# Patient Record
Sex: Male | Born: 1975 | Race: White | Hispanic: No | Marital: Single | Smoking: Current every day smoker
Health system: Southern US, Community
[De-identification: ages and names within clinical notes are randomized; demographics above are authoritative.]

## PROBLEM LIST (undated history)

## (undated) DIAGNOSIS — F329 Major depressive disorder, single episode, unspecified: Secondary | ICD-10-CM

## (undated) DIAGNOSIS — R768 Other specified abnormal immunological findings in serum: Secondary | ICD-10-CM

## (undated) DIAGNOSIS — F32A Depression, unspecified: Secondary | ICD-10-CM

---

## 2007-09-08 ENCOUNTER — Emergency Department (HOSPITAL_COMMUNITY): Admission: EM | Admit: 2007-09-08 | Discharge: 2007-09-09 | Payer: Self-pay | Admitting: Emergency Medicine

## 2007-11-20 ENCOUNTER — Emergency Department (HOSPITAL_COMMUNITY): Admission: EM | Admit: 2007-11-20 | Discharge: 2007-11-20 | Payer: Self-pay | Admitting: Emergency Medicine

## 2007-11-22 ENCOUNTER — Emergency Department (HOSPITAL_COMMUNITY): Admission: EM | Admit: 2007-11-22 | Discharge: 2007-11-22 | Payer: Self-pay | Admitting: Emergency Medicine

## 2011-06-24 ENCOUNTER — Emergency Department (HOSPITAL_COMMUNITY): Payer: Self-pay

## 2011-06-24 ENCOUNTER — Emergency Department (HOSPITAL_COMMUNITY)
Admission: EM | Admit: 2011-06-24 | Discharge: 2011-06-24 | Disposition: A | Discharge status: Home / Self Care | Payer: No Typology Code available for payment source | Attending: Emergency Medicine | Admitting: Emergency Medicine

## 2011-06-24 DIAGNOSIS — R748 Abnormal levels of other serum enzymes: Secondary | ICD-10-CM | POA: Insufficient documentation

## 2011-06-24 DIAGNOSIS — Y92410 Unspecified street and highway as the place of occurrence of the external cause: Secondary | ICD-10-CM | POA: Insufficient documentation

## 2011-06-24 DIAGNOSIS — F1910 Other psychoactive substance abuse, uncomplicated: Secondary | ICD-10-CM | POA: Insufficient documentation

## 2011-06-24 DIAGNOSIS — M542 Cervicalgia: Secondary | ICD-10-CM | POA: Insufficient documentation

## 2011-06-24 DIAGNOSIS — R109 Unspecified abdominal pain: Secondary | ICD-10-CM | POA: Insufficient documentation

## 2011-06-24 DIAGNOSIS — M549 Dorsalgia, unspecified: Secondary | ICD-10-CM | POA: Insufficient documentation

## 2011-06-24 LAB — DIFFERENTIAL
Eosinophils Absolute: 0.1 10*3/uL (ref 0.0–0.7)
Lymphocytes Relative: 63 % — ABNORMAL HIGH (ref 12–46)
Lymphs Abs: 5 10*3/uL — ABNORMAL HIGH (ref 0.7–4.0)
Monocytes Relative: 11 % (ref 3–12)
Neutrophils Relative %: 24 % — ABNORMAL LOW (ref 43–77)

## 2011-06-24 LAB — CBC
HCT: 44.6 % (ref 39.0–52.0)
Hemoglobin: 15 g/dL (ref 13.0–17.0)
MCH: 30.4 pg (ref 26.0–34.0)
MCV: 90.3 fL (ref 78.0–100.0)
RBC: 4.94 MIL/uL (ref 4.22–5.81)
WBC: 8 10*3/uL (ref 4.0–10.5)

## 2011-06-24 LAB — RAPID URINE DRUG SCREEN, HOSP PERFORMED
Amphetamines: NOT DETECTED
Opiates: POSITIVE — AB
Tetrahydrocannabinol: NOT DETECTED

## 2011-06-24 LAB — TYPE AND SCREEN
ABO/RH(D): O POS
Antibody Screen: NEGATIVE

## 2011-06-24 LAB — COMPREHENSIVE METABOLIC PANEL
AST: 354 U/L — ABNORMAL HIGH (ref 0–37)
BUN: 12 mg/dL (ref 6–23)
CO2: 27 mEq/L (ref 19–32)
Calcium: 8.9 mg/dL (ref 8.4–10.5)
Chloride: 103 mEq/L (ref 96–112)
Creatinine, Ser: 0.98 mg/dL (ref 0.50–1.35)
GFR calc Af Amer: >60 mL/min (ref >60)
GFR calc non Af Amer: >60 mL/min (ref >60)
Glucose, Bld: 84 mg/dL (ref 70–99)
Total Bilirubin: 0.8 mg/dL (ref 0.3–1.2)

## 2011-06-24 LAB — URINALYSIS, ROUTINE W REFLEX MICROSCOPIC
Glucose, UA: NEGATIVE mg/dL
Leukocytes, UA: NEGATIVE
Protein, ur: NEGATIVE mg/dL
Urobilinogen, UA: 1 mg/dL (ref 0.0–1.0)

## 2011-06-24 MED ORDER — IOHEXOL 300 MG/ML  SOLN
100.0000 mL | Freq: Once | INTRAMUSCULAR | Status: AC | PRN
  Administered 2011-06-24: 100 mL via INTRAVENOUS

## 2011-07-07 ENCOUNTER — Other Ambulatory Visit (HOSPITAL_COMMUNITY): Payer: Self-pay | Admitting: Family Medicine

## 2011-07-07 ENCOUNTER — Ambulatory Visit (HOSPITAL_COMMUNITY)
Admission: RE | Admit: 2011-07-07 | Discharge: 2011-07-07 | Disposition: A | Discharge status: Home / Self Care | Payer: Self-pay | Source: Ambulatory Visit | Attending: Family Medicine | Admitting: Family Medicine

## 2011-07-07 ENCOUNTER — Other Ambulatory Visit: Payer: Self-pay | Admitting: Internal Medicine

## 2011-07-07 DIAGNOSIS — S93409A Sprain of unspecified ligament of unspecified ankle, initial encounter: Secondary | ICD-10-CM

## 2011-07-07 DIAGNOSIS — M7989 Other specified soft tissue disorders: Secondary | ICD-10-CM | POA: Insufficient documentation

## 2011-07-07 DIAGNOSIS — M779 Enthesopathy, unspecified: Secondary | ICD-10-CM

## 2011-07-07 DIAGNOSIS — M25579 Pain in unspecified ankle and joints of unspecified foot: Secondary | ICD-10-CM | POA: Insufficient documentation

## 2011-07-07 DIAGNOSIS — M79609 Pain in unspecified limb: Secondary | ICD-10-CM | POA: Insufficient documentation

## 2011-09-30 LAB — URINALYSIS, ROUTINE W REFLEX MICROSCOPIC
Hgb urine dipstick: NEGATIVE
Nitrite: NEGATIVE
Protein, ur: NEGATIVE
Urobilinogen, UA: 0.2

## 2012-07-23 DIAGNOSIS — IMO0002 Reserved for concepts with insufficient information to code with codable children: Secondary | ICD-10-CM
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
## 2015-07-24 ENCOUNTER — Encounter (HOSPITAL_COMMUNITY): Payer: Self-pay | Admitting: *Deleted

## 2015-07-24 ENCOUNTER — Emergency Department (HOSPITAL_COMMUNITY): Payer: Self-pay

## 2015-07-24 ENCOUNTER — Inpatient Hospital Stay (HOSPITAL_COMMUNITY)
Admission: EM | Admit: 2015-07-24 | Discharge: 2015-07-29 | DRG: 558 | Disposition: A | Discharge status: Home / Self Care | Payer: Self-pay | Attending: Family Medicine | Admitting: Family Medicine

## 2015-07-24 DIAGNOSIS — M791 Myalgia, unspecified site: Secondary | ICD-10-CM | POA: Diagnosis present

## 2015-07-24 DIAGNOSIS — E860 Dehydration: Secondary | ICD-10-CM | POA: Diagnosis present

## 2015-07-24 DIAGNOSIS — R55 Syncope and collapse: Secondary | ICD-10-CM | POA: Diagnosis present

## 2015-07-24 DIAGNOSIS — F418 Other specified anxiety disorders: Secondary | ICD-10-CM | POA: Diagnosis present

## 2015-07-24 DIAGNOSIS — Z8249 Family history of ischemic heart disease and other diseases of the circulatory system: Secondary | ICD-10-CM

## 2015-07-24 DIAGNOSIS — Z720 Tobacco use: Secondary | ICD-10-CM | POA: Diagnosis present

## 2015-07-24 DIAGNOSIS — R768 Other specified abnormal immunological findings in serum: Secondary | ICD-10-CM

## 2015-07-24 DIAGNOSIS — M6282 Rhabdomyolysis: Principal | ICD-10-CM | POA: Diagnosis present

## 2015-07-24 DIAGNOSIS — R740 Nonspecific elevation of levels of transaminase and lactic acid dehydrogenase [LDH]: Secondary | ICD-10-CM

## 2015-07-24 DIAGNOSIS — R7401 Elevation of levels of liver transaminase levels: Secondary | ICD-10-CM | POA: Diagnosis present

## 2015-07-24 DIAGNOSIS — R7689 Other specified abnormal immunological findings in serum: Secondary | ICD-10-CM

## 2015-07-24 DIAGNOSIS — R531 Weakness: Secondary | ICD-10-CM

## 2015-07-24 DIAGNOSIS — F17210 Nicotine dependence, cigarettes, uncomplicated: Secondary | ICD-10-CM | POA: Diagnosis present

## 2015-07-24 HISTORY — DX: Depression, unspecified: F32.A

## 2015-07-24 HISTORY — DX: Other specified abnormal immunological findings in serum: R76.8

## 2015-07-24 HISTORY — DX: Major depressive disorder, single episode, unspecified: F32.9

## 2015-07-24 LAB — CBC WITH DIFFERENTIAL/PLATELET
Basophils Absolute: 0 10*3/uL (ref 0.0–0.1)
Basophils Relative: 1 % (ref 0–1)
Eosinophils Absolute: 0.2 10*3/uL (ref 0.0–0.7)
Eosinophils Relative: 4 % (ref 0–5)
HEMATOCRIT: 40.8 % (ref 39.0–52.0)
Hemoglobin: 14.2 g/dL (ref 13.0–17.0)
LYMPHS ABS: 2.6 10*3/uL (ref 0.7–4.0)
LYMPHS PCT: 46 % (ref 12–46)
MCH: 30.1 pg (ref 26.0–34.0)
MCHC: 34.8 g/dL (ref 30.0–36.0)
MCV: 86.4 fL (ref 78.0–100.0)
MONO ABS: 0.7 10*3/uL (ref 0.1–1.0)
Monocytes Relative: 11 % (ref 3–12)
NEUTROS PCT: 38 % — AB (ref 43–77)
Neutro Abs: 2.2 10*3/uL (ref 1.7–7.7)
Platelets: 184 10*3/uL (ref 150–400)
RBC: 4.72 MIL/uL (ref 4.22–5.81)
RDW: 14.3 % (ref 11.5–15.5)
WBC: 5.7 10*3/uL (ref 4.0–10.5)

## 2015-07-24 LAB — COMPREHENSIVE METABOLIC PANEL
ALBUMIN: 3.9 g/dL (ref 3.5–5.0)
ALT: 123 U/L — AB (ref 17–63)
ANION GAP: 6 (ref 5–15)
AST: 237 U/L — AB (ref 15–41)
Alkaline Phosphatase: 64 U/L (ref 38–126)
BUN: 11 mg/dL (ref 6–20)
CHLORIDE: 104 mmol/L (ref 101–111)
CO2: 29 mmol/L (ref 22–32)
CREATININE: 1.16 mg/dL (ref 0.61–1.24)
Calcium: 8.4 mg/dL — ABNORMAL LOW (ref 8.9–10.3)
GFR calc Af Amer: >60 mL/min (ref >60)
GFR calc non Af Amer: >60 mL/min (ref >60)
Glucose, Bld: 89 mg/dL (ref 65–99)
POTASSIUM: 3.6 mmol/L (ref 3.5–5.1)
Sodium: 139 mmol/L (ref 135–145)
Total Bilirubin: 0.7 mg/dL (ref 0.3–1.2)
Total Protein: 6.6 g/dL (ref 6.5–8.1)

## 2015-07-24 LAB — CK: Total CK: 6941 U/L — ABNORMAL HIGH (ref 49–397)

## 2015-07-24 MED ORDER — SODIUM CHLORIDE 0.9 % IV BOLUS (SEPSIS)
1000.0000 mL | Freq: Once | INTRAVENOUS | Status: AC
  Administered 2015-07-24: 1000 mL via INTRAVENOUS

## 2015-07-24 MED ORDER — TRAMADOL HCL 50 MG PO TABS
50.0000 mg | ORAL_TABLET | Freq: Once | ORAL | Status: AC
  Administered 2015-07-24: 50 mg via ORAL
  Filled 2015-07-24: qty 1

## 2015-07-24 NOTE — ED Notes (Signed)
Pt reporting that he has been weak since Tuesday. States that he has had leg cramps and weakness worsening since that time.  Reports being seen at [facility] on Tuesday and was diagnosed with dehydration. Pt denies nausea or vomiting.  Tolerating p.o liquids without difficulty.

## 2015-07-24 NOTE — H&P (Addendum)
[facility] History and Physical  GAYLORD SEYDEL ZOX:[id number] DOB: October 02, 1976 DOA: 07/24/2015  Referring physician: Dr. Jeraldine Loots - [facility] PCP: Colette Ribas, MD   Chief Complaint: Leg pain  HPI: Mario Sharp is a 39 y.o. male  3 days patient developed a syncopal or near-syncopal episode while washing his truck. Patient states he had just barely started washing his truck with a friend when he started to feel ill and collapsed to the ground. Very poor recollection as to what occurred during this period of time that was witnessed by his friend. States that the patient was moving around not responding well to his questions. No direct admission to loss of consciousness. No head injury. Patient states that he went to [facility] for evaluation was told that he was dehydrated and given 2 L of IV normal saline. Patient states that he did not feel any better after the fluids and over the last 2 days has continued to feel aggressively more ill. One of his primary complaints is been bilateral lower extremity pain. States that his legs ache severely. Constant. Getting worse. No relief and had very little sleep last night. Patient is not taken anything for his pain. Patient states that he has not exerted himself recently. Does not exercise. Sweats very little. Drinks about a half a gallon of iced tea per day, though over the last 2 days he's had very little to drink or eat due to the loss of appetite. Has never had symptoms like this before. He states that he is followed multiple times at home over the last 2 days as his legs and just collapsed underneath of him. He developed a slight headache 1 day ago which is now resolved. Denies any drug use. Denies any joint swelling, rash.  Review of Systems:  Constitutional:  No weight loss, night sweats, Fevers, chills,  HEENT:  No Difficulty swallowing,Tooth/dental problems,Sore throat,  No sneezing, itching, ear ache, nasal congestion, post nasal  drip,  Cardio-vascular:  No chest pain, Orthopnea, PND, swelling in lower extremities, anasarca, dizziness, palpitations  GI:  No heartburn, indigestion, abdominal pain, nausea, vomiting, diarrhea, change in bowel habits,   Resp:   No shortness of breath with exertion or at rest. No excess mucus, no productive cough, No non-productive cough, No coughing up of blood.No change in color of mucus.No wheezing.No chest wall deformity  Skin:  no rash or lesions.  GU:  no dysuria, change in color of urine, no urgency or frequency. No flank pain.  Musculoskeletal:   Per HPI Psych:  No change in mood or affect. No depression or anxiety. No memory loss.   Past Medical History  Diagnosis Date  . Depression    History reviewed. No pertinent past surgical history. Social History:  reports that he has been smoking Cigarettes.  He has been smoking about 0.50 packs per day. He does not have any smokeless tobacco history on file. He reports that he drinks alcohol. He reports that he does not use illicit drugs.  No Known Allergies  Family History  Problem Relation Age of Onset  . Coronary artery disease Father     quadruple bypass     Prior to Admission medications   Medication Sig Start Date End Date Taking? Authorizing Provider  ALPRAZolam Prudy Feeler) 1 MG tablet Take 1.5 mg by mouth daily as needed for anxiety.    Yes Historical Provider, MD  sertraline (ZOLOFT) 100 MG tablet Take 100 mg by mouth daily.   Yes Historical  Provider, MD   Physical Exam: Filed Vitals:   07/24/15 1927 07/24/15 2206 07/24/15 2319 07/24/15 2322  BP: 145/99 125/82 127/84 127/84  Pulse: 106 85 86 90  Temp: 98.1 F (36.7 C)     TempSrc: Oral     Resp: Height: 6' (1.829 m)     Weight: 103.874 kg (229 lb)     SpO2: 98% 98% 100% 98%    Wt Readings from Last 3 Encounters:  07/24/15 103.874 kg (229 lb)    General:  Appears calm and comfortable Eyes:  PERRL, normal lids, irises & conjunctiva ENT:   grossly normal hearing, lips & tongue Neck:  no LAD, masses or thyromegaly Cardiovascular:  RRR, no m/r/g. No LE edema. 2+ pedal pulses, warm extremities Respiratory:  CTA bilaterally, no w/r/r. Normal respiratory effort. Abdomen:  soft, ntnd Skin:  no rash or induration seen on limited exam Musculoskeletal:  grossly normal tone BUE/BLE Psychiatric:  grossly normal mood and affect, speech fluent and appropriate Neurologic:  grossly non-focal.          Labs on Admission:  Basic Metabolic Panel:  Recent Labs Lab 07/24/15 2101  NA 139  K 3.6  CL 104  CO2 29  GLUCOSE 89  BUN 11  CREATININE 1.16  CALCIUM 8.4*   Liver Function Tests:  Recent Labs Lab 07/24/15 2101  AST 237*  ALT 123*  ALKPHOS 64  BILITOT 0.7  PROT 6.6  ALBUMIN 3.9   No results for input(s): LIPASE, AMYLASE in the last 168 hours. No results for input(s): AMMONIA in the last 168 hours. CBC:  Recent Labs Lab 07/24/15 2101  WBC 5.7  NEUTROABS 2.2  HGB 14.2  HCT 40.8  MCV 86.4  PLT 184   Cardiac Enzymes:  Recent Labs Lab 07/24/15 2101  CKTOTAL 6941*    BNP (last 3 results) No results for input(s): BNP in the last 8760 hours.  ProBNP (last 3 results) No results for input(s): PROBNP in the last 8760 hours.  CBG: No results for input(s): GLUCAP in the last 168 hours.  Radiological Exams on Admission: Ct Head Wo Contrast  07/24/2015   CLINICAL DATA:  Acute onset of gradually worsening weakness. Syncope. Drooling and headache. Initial encounter.  EXAM: CT HEAD WITHOUT CONTRAST  TECHNIQUE: Contiguous axial images were obtained from the base of the skull through the vertex without intravenous contrast.  COMPARISON:  None.  FINDINGS: There is no evidence of acute infarction, mass lesion, or intra- or extra-axial hemorrhage on CT.  The posterior fossa, including the cerebellum, brainstem and fourth ventricle, is within normal limits. The third and lateral ventricles, and basal ganglia are  unremarkable in appearance. The cerebral hemispheres are symmetric in appearance, with normal gray-white differentiation. No mass effect or midline shift is seen.  There is no evidence of fracture; visualized osseous structures are unremarkable in appearance. The visualized portions of the orbits are within normal limits. The paranasal sinuses and mastoid air cells are well-aerated. No significant soft tissue abnormalities are seen.  IMPRESSION: Unremarkable noncontrast CT of the head.   Electronically Signed   By: Roanna Raider M.D.   On: 07/24/2015 22:38    Assessment/Plan Principal Problem:   Rhabdomyolysis Active Problems:   Near syncope   Transaminitis   Depression with anxiety   Tobacco use   Rhabdomyolysis: CK 6941. Etiology not immediately clear that may be due to dehydration, heat exhaustion, drug use, other metabolic myopathy. Pedal pulses present. K normal -  tele (due to syncope or near syncope) - Trend CK in am - IVF NS 133ml/hr - UDS - Tylenol, morphine  Near syncope vs syncope: likely related to dehydration vs vasovagal vs arrhythmia. No evidence of seizure like activity. Only one occurrence 2 days ago - Tele - orthostatics - IVF as above  Transaminitis: AST 237, ALT 123. Pt states he drinks a 6 pack of beer over several weeks. Likely from rhabdo.  - CMET in am - ETOH  Depression/anxiety: - Continue zoloft and xanax  Tobacco: 1/2ppd - nicotine   Code Status: FUll DVT Prophylaxis: Lovenox Family Communication: father Disposition Plan: pending improvement  Anthonie Lotito J, MD Family Medicine [facility] [email or web address] Password [email or web address]

## 2015-07-24 NOTE — ED Notes (Signed)
Pt. Reports generalized weakness since Tuesday. Pt. Reports leg cramps. Pt. Reports poor appetite but denies nausea/vomiting/diarrhea.

## 2015-07-24 NOTE — ED Provider Notes (Signed)
CSN: [id number]     Arrival date & time 07/24/15  1922 History  This chart was scribed for Mario Munch, MD by Phillis Haggis, ED Scribe. This patient was seen in room [facility] and patient care was started at 9:02 PM.     Chief Complaint  Patient presents with  . Weakness   The history is provided by the patient. No language interpreter was used.  HPI Comments: Mario Sharp is a 39 y.o. male who presents to the Emergency Department complaining of gradually worsening weakness onset 2 days ago. Pt states that he was helping a friend with yard work when he experienced syncope and was unable to stand up after with drooling. States that he was seen at [facility] and given saline with a diagnosis of dehydration. States that he has been unable to keep balance and stand up, with continual cramping leg pain, hurting like "toothaches." Reports that the weakness is all over, but worst in the legs with associated headache that has since subsided. Denies confusion, fever, neck pain, back pain, sick contacts, recent long travel, insect bites, or any significant medical history. States that he takes Zoloft and Xanax daily but is decreasing Zoloft and his smoking of .5 ppd.   Smoking cessation provided, particularly in light of this patient's evaluation in the ED.   Past Medical History  Diagnosis Date  . Depression    History reviewed. No pertinent past surgical history. History reviewed. No pertinent family history. History  Substance Use Topics  . Smoking status: Current Every Day Smoker -- 1.00 packs/day  . Smokeless tobacco: Not on file  . Alcohol Use: Yes     Comment: occasion    Review of Systems  Constitutional:       Per HPI, otherwise negative  HENT:       Per HPI, otherwise negative  Respiratory:       Per HPI, otherwise negative  Cardiovascular:       Per HPI, otherwise negative  Gastrointestinal: Negative for vomiting.  Endocrine:       Negative aside from HPI  Genitourinary:        Neg aside from HPI   Musculoskeletal:       Per HPI, otherwise negative  Skin: Negative.   Neurological: Negative for syncope.   Allergies  Review of patient's allergies indicates no known allergies.  Home Medications   Prior to Admission medications   Medication Sig Start Date End Date Taking? Authorizing Provider  ALPRAZolam Prudy Feeler) 1 MG tablet Take 1.5 mg by mouth daily as needed for anxiety.    Yes Historical Provider, MD  sertraline (ZOLOFT) 100 MG tablet Take 100 mg by mouth daily.   Yes Historical Provider, MD   BP 145/99 mmHg  Pulse 106  Temp(Src) 98.1 F (36.7 C) (Oral)  Resp 20  Ht 6' (1.829 m)  Wt 229 lb (103.874 kg)  BMI 31.05 kg/m2  SpO2 98%  Physical Exam  Constitutional: He is oriented to person, place, and time. He appears well-developed. No distress.  HENT:  Head: Normocephalic and atraumatic.  Eyes: Conjunctivae and EOM are normal.  Neck: Normal range of motion. Neck supple.  Cardiovascular: Normal rate and regular rhythm.   Pulmonary/Chest: Effort normal. No stridor. No respiratory distress.  Abdominal: He exhibits no distension.  Musculoskeletal: He exhibits no edema.  Pain with hip flexion bilaterally, but strength is 5/5 bilaterally  Neurological: He is alert and oriented to person, place, and time.  Grip strength 5/5; no pronator  drift; sensation intact  Skin: Skin is warm and dry.  Psychiatric: He has a normal mood and affect.  Nursing note and vitals reviewed.   ED Course  Procedures (including critical care time) DIAGNOSTIC STUDIES: Oxygen Saturation is 98% on RA, normal by my interpretation.    COORDINATION OF CARE: 9:07 PM-Discussed treatment plan which includes labs and fluids with pt at bedside and pt agreed to plan.    Labs Review Labs Reviewed  COMPREHENSIVE METABOLIC PANEL - Abnormal; Notable for the following:    Calcium 8.4 (*)    AST 237 (*)    ALT 123 (*)    All other components within normal limits  CBC WITH  DIFFERENTIAL/PLATELET - Abnormal; Notable for the following:    Neutrophils Relative % 38 (*)    All other components within normal limits  CK - Abnormal; Notable for the following:    Total CK 6941 (*)    All other components within normal limits  URINALYSIS, ROUTINE W REFLEX MICROSCOPIC (NOT AT [facility])    Imaging Review Ct Head Wo Contrast  07/24/2015   CLINICAL DATA:  Acute onset of gradually worsening weakness. Syncope. Drooling and headache. Initial encounter.  EXAM: CT HEAD WITHOUT CONTRAST  TECHNIQUE: Contiguous axial images were obtained from the base of the skull through the vertex without intravenous contrast.  COMPARISON:  None.  FINDINGS: There is no evidence of acute infarction, mass lesion, or intra- or extra-axial hemorrhage on CT.  The posterior fossa, including the cerebellum, brainstem and fourth ventricle, is within normal limits. The third and lateral ventricles, and basal ganglia are unremarkable in appearance. The cerebral hemispheres are symmetric in appearance, with normal gray-white differentiation. No mass effect or midline shift is seen.  There is no evidence of fracture; visualized osseous structures are unremarkable in appearance. The visualized portions of the orbits are within normal limits. The paranasal sinuses and mastoid air cells are well-aerated. No significant soft tissue abnormalities are seen.  IMPRESSION: Unremarkable noncontrast CT of the head.   Electronically Signed   By: Roanna Raider M.D.   On: 07/24/2015 22:38     EKG Interpretation   Date/Time:  Thursday July 24 2015 21:41:56 EDT Ventricular Rate:  77 PR Interval:  130 QRS Duration: 96 QT Interval:  387 QTC Calculation: 438 R Axis:   87 Text Interpretation:  Sinus rhythm Baseline wander in lead(s) V1 Sinus  rhythm T wave abnormality Artifact Abnormal ekg Confirmed by Mario Munch  MD [phone]) on 07/24/2015 10:31:05 PM     On repeat exam the patient continues to complain of diffuse myalgia,  particularly in bilateral thighs. We had a lengthy conversation about all findings, particularly the patient's substantially elevated CK level.   MDM  She presents with several days of ongoing diffuse fatigue, myalgia, one episode of syncope, and persistent lightheadedness. Initial suspicion for rhabdomyolysis versus infectious etiology. Patient's labs consistent with rhabdomyolysis. CT scan, EKG reassuring, and low suspicion for cardiogenic syncope. Patient received substantial fluid resuscitation, but given the CK of greater than 6000, the patient was admitted for continuous fluid resuscitation, urine output monitoring.  I personally performed the services described in this documentation, which was scribed in my presence. The recorded information has been reviewed and is accurate.     Mario Munch, MD 07/24/15 2306

## 2015-07-25 ENCOUNTER — Encounter (HOSPITAL_COMMUNITY): Payer: Self-pay

## 2015-07-25 DIAGNOSIS — R740 Nonspecific elevation of levels of transaminase and lactic acid dehydrogenase [LDH]: Secondary | ICD-10-CM

## 2015-07-25 DIAGNOSIS — M6282 Rhabdomyolysis: Principal | ICD-10-CM

## 2015-07-25 DIAGNOSIS — R7401 Elevation of levels of liver transaminase levels: Secondary | ICD-10-CM | POA: Diagnosis present

## 2015-07-25 DIAGNOSIS — Z720 Tobacco use: Secondary | ICD-10-CM | POA: Diagnosis present

## 2015-07-25 DIAGNOSIS — R55 Syncope and collapse: Secondary | ICD-10-CM | POA: Diagnosis present

## 2015-07-25 DIAGNOSIS — M791 Myalgia, unspecified site: Secondary | ICD-10-CM | POA: Diagnosis present

## 2015-07-25 DIAGNOSIS — F418 Other specified anxiety disorders: Secondary | ICD-10-CM | POA: Diagnosis present

## 2015-07-25 LAB — CBC
HEMATOCRIT: 39.2 % (ref 39.0–52.0)
HEMOGLOBIN: 13.6 g/dL (ref 13.0–17.0)
MCH: 30.2 pg (ref 26.0–34.0)
MCHC: 34.7 g/dL (ref 30.0–36.0)
MCV: 86.9 fL (ref 78.0–100.0)
Platelets: 203 10*3/uL (ref 150–400)
RBC: 4.51 MIL/uL (ref 4.22–5.81)
RDW: 14.4 % (ref 11.5–15.5)
WBC: 6.5 10*3/uL (ref 4.0–10.5)

## 2015-07-25 LAB — URINALYSIS, ROUTINE W REFLEX MICROSCOPIC
Bilirubin Urine: NEGATIVE
Glucose, UA: NEGATIVE mg/dL
KETONES UR: NEGATIVE mg/dL
LEUKOCYTES UA: NEGATIVE
Nitrite: NEGATIVE
PH: 6 (ref 5.0–8.0)
Protein, ur: NEGATIVE mg/dL
Specific Gravity, Urine: <1.005 — ABNORMAL LOW (ref 1.005–1.030)
Urobilinogen, UA: 0.2 mg/dL (ref 0.0–1.0)

## 2015-07-25 LAB — CK: Total CK: 9647 U/L — ABNORMAL HIGH (ref 49–397)

## 2015-07-25 LAB — MAGNESIUM: Magnesium: 2 mg/dL (ref 1.7–2.4)

## 2015-07-25 LAB — URINE MICROSCOPIC-ADD ON

## 2015-07-25 LAB — COMPREHENSIVE METABOLIC PANEL
ALBUMIN: 3.3 g/dL — AB (ref 3.5–5.0)
ALK PHOS: 58 U/L (ref 38–126)
ALT: 121 U/L — AB (ref 17–63)
ANION GAP: 8 (ref 5–15)
AST: 258 U/L — AB (ref 15–41)
BILIRUBIN TOTAL: 0.6 mg/dL (ref 0.3–1.2)
BUN: 13 mg/dL (ref 6–20)
CALCIUM: 8.4 mg/dL — AB (ref 8.9–10.3)
CO2: 26 mmol/L (ref 22–32)
Chloride: 109 mmol/L (ref 101–111)
Creatinine, Ser: 1.02 mg/dL (ref 0.61–1.24)
GFR calc Af Amer: >60 mL/min (ref >60)
GLUCOSE: 99 mg/dL (ref 65–99)
Potassium: 3.7 mmol/L (ref 3.5–5.1)
Sodium: 143 mmol/L (ref 135–145)
Total Protein: 6 g/dL — ABNORMAL LOW (ref 6.5–8.1)

## 2015-07-25 LAB — RAPID URINE DRUG SCREEN, HOSP PERFORMED
Amphetamines: NOT DETECTED
BARBITURATES: NOT DETECTED
BENZODIAZEPINES: POSITIVE — AB
Cocaine: NOT DETECTED
Opiates: NOT DETECTED
Tetrahydrocannabinol: NOT DETECTED

## 2015-07-25 LAB — ETHANOL

## 2015-07-25 LAB — PHOSPHORUS: Phosphorus: 3.9 mg/dL (ref 2.5–4.6)

## 2015-07-25 MED ORDER — ENOXAPARIN SODIUM 40 MG/0.4ML ~~LOC~~ SOLN
40.0000 mg | SUBCUTANEOUS | Status: DC
  Administered 2015-07-25 – 2015-07-29 (×4): 40 mg via SUBCUTANEOUS
  Filled 2015-07-25 (×4): qty 0.4

## 2015-07-25 MED ORDER — ONDANSETRON HCL 4 MG PO TABS: 4.0000 mg | ORAL_TABLET | Freq: Four times a day (QID) | ORAL | Status: DC | PRN

## 2015-07-25 MED ORDER — SODIUM CHLORIDE 0.9 % IV SOLN
INTRAVENOUS | Status: DC
  Administered 2015-07-25 (×3): via INTRAVENOUS

## 2015-07-25 MED ORDER — ONDANSETRON HCL 4 MG/2ML IJ SOLN: 4.0000 mg | Freq: Four times a day (QID) | INTRAMUSCULAR | Status: DC | PRN

## 2015-07-25 MED ORDER — SODIUM CHLORIDE 0.9 % IV SOLN
INTRAVENOUS | Status: DC
  Administered 2015-07-25 – 2015-07-27 (×11): via INTRAVENOUS
  Administered 2015-07-28: 1000 mL via INTRAVENOUS
  Administered 2015-07-28: 12:00:00 via INTRAVENOUS
  Administered 2015-07-28: 1000 mL via INTRAVENOUS
  Administered 2015-07-28 – 2015-07-29 (×2): via INTRAVENOUS

## 2015-07-25 MED ORDER — ALPRAZOLAM 0.5 MG PO TABS
1.5000 mg | ORAL_TABLET | Freq: Every day | ORAL | Status: DC | PRN
  Administered 2015-07-25 – 2015-07-26 (×2): 1.5 mg via ORAL
  Filled 2015-07-25 (×4): qty 1

## 2015-07-25 MED ORDER — SODIUM CHLORIDE 0.9 % IJ SOLN
3.0000 mL | Freq: Two times a day (BID) | INTRAMUSCULAR | Status: DC
  Administered 2015-07-25 – 2015-07-29 (×3): 3 mL via INTRAVENOUS

## 2015-07-25 MED ORDER — OXYCODONE HCL 5 MG PO TABS
5.0000 mg | ORAL_TABLET | ORAL | Status: DC | PRN
  Administered 2015-07-25 – 2015-07-29 (×15): 5 mg via ORAL
  Filled 2015-07-25 (×15): qty 1

## 2015-07-25 MED ORDER — MORPHINE SULFATE 4 MG/ML IJ SOLN
4.0000 mg | INTRAMUSCULAR | Status: DC | PRN
  Administered 2015-07-25 (×5): 4 mg via INTRAVENOUS
  Filled 2015-07-25 (×5): qty 1

## 2015-07-25 MED ORDER — SERTRALINE HCL 50 MG PO TABS
100.0000 mg | ORAL_TABLET | Freq: Every day | ORAL | Status: DC
  Administered 2015-07-25 – 2015-07-26 (×2): 100 mg via ORAL
  Filled 2015-07-25 (×2): qty 2

## 2015-07-25 MED ORDER — NICOTINE 14 MG/24HR TD PT24
14.0000 mg | MEDICATED_PATCH | Freq: Every day | TRANSDERMAL | Status: DC
  Filled 2015-07-25 (×4): qty 1

## 2015-07-25 MED ORDER — ACETAMINOPHEN 650 MG RE SUPP: 650.0000 mg | Freq: Four times a day (QID) | RECTAL | Status: DC | PRN

## 2015-07-25 MED ORDER — ACETAMINOPHEN 325 MG PO TABS
650.0000 mg | ORAL_TABLET | Freq: Four times a day (QID) | ORAL | Status: DC | PRN
  Administered 2015-07-25: 650 mg via ORAL
  Filled 2015-07-25: qty 2

## 2015-07-25 MED ORDER — SODIUM CHLORIDE 0.9 % IV SOLN: INTRAVENOUS | Status: DC

## 2015-07-25 NOTE — Progress Notes (Addendum)
PROGRESS NOTE  STEADMAN PROSPERI JXB:[id number] DOB: 02-01-1976 DOA: 07/24/2015 PCP: Colette Ribas, MD  Summary: 53 yom presented with complaints of leg pain and near syncopal episode. Admitted for Rhabdomyolysis and further evaluation.   Assessment/Plan: 1. Acute rhabdomyolysis. Symptomatically resolved, excellent urine output, creatinine reserved. Repeat CK pending. Etiology unclear. 2. Syncope, possibly secondary to dehydration, no recurrence, telemetry unremarkable. 3. Transaminitis, stable. Secondary to acute rhabdomyolysis. 4. Depression with anxiety. Stable 5. Tobacco use.    Overall improved, feels "100%" today.  Follow up CK   Likely home later today   ADDENDUM Repeat CK higher D/w patient Will continue aggressive IVF and recheck CK in AM Etiology remains unclear. He does not exercise and denies overexertion. Not on any likely offending meds. He is now asymptomatic. Hopefully home in AM  Code Status: Full DVT prophylaxis: Lovenox Family Communication: Discussed with patient. He understands and has no concerns at this time.  Disposition Plan: Anticipate today  Brendia Sacks, MD  [facility]  Pager [phone] If 7PM-7AM, please contact night-coverage at [email or web address], password [email or web address] 07/25/2015, 7:18 AM    Consultants:    Procedures:    Antibiotics:    HPI/Subjective: Feels better, pain in his legs have resolved. Was able to eat and was ambulatory without difficulty. No reports of chest pain, shortness of breath, n/v/d, or abd pain.  Objective: Filed Vitals:   07/24/15 2319 07/24/15 2322 07/25/15 0040 07/25/15 0500  BP: 127/84 127/84 129/76 110/91  Pulse: 86 90 93 91  Temp:   97.5 F (36.4 C) 97.7 F (36.5 C)  TempSrc:   Oral Axillary  Resp:  16 16 16   Height:   6' (1.829 m)   Weight:   104.327 kg (230 lb)   SpO2: 100% 98% 97% 100%    Intake/Output Summary (Last 24 hours) at 07/25/15 0718 Last data filed at 07/25/15 0558  Gross per 24  hour  Intake    240 ml  Output    700 ml  Net   -460 ml     Filed Weights   07/24/15 1927 07/25/15 0040  Weight: 103.874 kg (229 lb) 104.327 kg (230 lb)    Exam: Afebrile, VSS, not hypoxic General:  Appears calm and comfortable,laying in bed. Eyes: PERRL, normal lids, irises & conjunctiva ENT: grossly normal hearing, lips & tongue Neck: no LAD, masses or thyromegaly Cardiovascular: RRR, no m/r/g. No LE edema.  Respiratory: CTA bilaterally, no w/r/r. Normal respiratory effort. Abdomen: soft, non tender, nd Skin: no rash or induration seen on limited exam Musculoskeletal: grossly normal tone BUE/BLE.  Psychiatric: grossly normal mood and affect, speech fluent and appropriate Neurologic: grossly non-focal.  New data reviewed:  Urine output 700+  Orthostatics negative  BMP unremarkable  AST without significant change 258, ALT without change 121.  UA notable for hgb uria  CK 6941, repeat pending.  Pertinent data since admission:  CT head unremarkable  EKG independently reviewed. SR no acute changes  Pending data:    Scheduled Meds: . sodium chloride   Intravenous STAT  . enoxaparin (LOVENOX) injection  40 mg Subcutaneous Q24H  . nicotine  14 mg Transdermal Daily  . sertraline  100 mg Oral Daily  . sodium chloride  3 mL Intravenous Q12H   Continuous Infusions: . sodium chloride 150 mL/hr at 07/25/15 0122    Principal Problem:   Rhabdomyolysis Active Problems:   Near syncope   Transaminitis   Depression with anxiety   Tobacco use   Weakness  Greggory Keen Jari Pigg, acting as scribe, recorded this note contemporaneously in the presence of Dr. Melton Alar. Irene Limbo, M.D. on 07/25/2015 .   I have reviewed the above documentation for accuracy and completeness, and I agree with the above. Brendia Sacks, MD

## 2015-07-25 NOTE — ED Notes (Signed)
Urinalysis slip printed in error. Pt. Was not catheterized.

## 2015-07-25 NOTE — Care Management Note (Signed)
Case Management Note  Patient Details  Name: Mario Sharp MRN: [id number] Date of Birth: 1976/08/09  Subjective/Objective:                  Pt admitted from home with rhabdomyolysis, Pt lives with his mother and will return home at discharge. Pt is independent with ADL's. Pt has no insurance or PCP.  Action/Plan: Will arrange hospital follow up. Financial counselor is aware of self pay status.  Expected Discharge Date:  07/28/15               Expected Discharge Plan:  Home/Self Care  In-House Referral:  Financial Counselor  Discharge planning Services  CM Consult  Post Acute Care Choice:  NA Choice offered to:  NA  DME Arranged:    DME Agency:     HH Arranged:    HH Agency:     Status of Service:  Completed, signed off  Medicare Important Message Given:    Date Medicare IM Given:    Medicare IM give by:    Date Additional Medicare IM Given:    Additional Medicare Important Message give by:     If discussed at Long Length of Stay Meetings, dates discussed:    Additional Comments:  Cheryl Flash, RN 07/25/2015, 8:21 AM

## 2015-07-25 NOTE — ED Notes (Signed)
Hospitalist at bedside 

## 2015-07-26 LAB — COMPREHENSIVE METABOLIC PANEL
ALT: 156 U/L — ABNORMAL HIGH (ref 17–63)
AST: 397 U/L — AB (ref 15–41)
Albumin: 3.3 g/dL — ABNORMAL LOW (ref 3.5–5.0)
Alkaline Phosphatase: 65 U/L (ref 38–126)
Anion gap: 7 (ref 5–15)
BUN: 9 mg/dL (ref 6–20)
CHLORIDE: 107 mmol/L (ref 101–111)
CO2: 28 mmol/L (ref 22–32)
Calcium: 8.3 mg/dL — ABNORMAL LOW (ref 8.9–10.3)
Creatinine, Ser: 0.89 mg/dL (ref 0.61–1.24)
GFR calc non Af Amer: >60 mL/min (ref >60)
Glucose, Bld: 82 mg/dL (ref 65–99)
POTASSIUM: 4.1 mmol/L (ref 3.5–5.1)
SODIUM: 142 mmol/L (ref 135–145)
Total Bilirubin: 0.6 mg/dL (ref 0.3–1.2)
Total Protein: 5.9 g/dL — ABNORMAL LOW (ref 6.5–8.1)

## 2015-07-26 LAB — LACTATE DEHYDROGENASE: LDH: 592 U/L — ABNORMAL HIGH (ref 98–192)

## 2015-07-26 LAB — TSH: TSH: 1.367 u[IU]/mL (ref 0.350–4.500)

## 2015-07-26 LAB — CK: Total CK: 14320 U/L — ABNORMAL HIGH (ref 49–397)

## 2015-07-26 LAB — SEDIMENTATION RATE: SED RATE: 5 mm/h (ref 0–16)

## 2015-07-26 MED ORDER — MORPHINE SULFATE 10 MG/ML IJ SOLN
5.0000 mg | INTRAMUSCULAR | Status: DC | PRN
  Administered 2015-07-26 – 2015-07-29 (×15): 5 mg via INTRAVENOUS
  Filled 2015-07-26 (×15): qty 1

## 2015-07-26 NOTE — Progress Notes (Signed)
Consent obtained from patient, placed on chart, for records to be requested from [facility] ([place]

## 2015-07-26 NOTE — Progress Notes (Signed)
[facility] PROGRESS NOTE  Mario Sharp:[id number] DOB: 1976/02/24 DOA: 07/24/2015 PCP: Colette Ribas, MD    Code Status: Full code Family Communication: Family not available Disposition Plan: Discharge when clinically appropriate   Consultants:  None  Procedures:  None  Antibiotics:  None  HPI/Subjective: Patient says that his proximal leg muscles are still sore. Otherwise, he has no complaints.    Objective: Filed Vitals:   07/26/15 0708  BP: 120/87  Pulse: 61  Temp: 97.5 F (36.4 C)  Resp: 18   oxygen saturation 95% on room air.  Intake/Output Summary (Last 24 hours) at 07/26/15 1425 Last data filed at 07/26/15 1211  Gross per 24 hour  Intake 5755.83 ml  Output   5000 ml  Net 755.83 ml   Filed Weights   07/24/15 1927 07/25/15 0040  Weight: 103.874 kg (229 lb) 104.327 kg (230 lb)    Exam:   General:  39 year old man, alert, in no acute distress.  Cardiovascular: S1, S2, no murmurs rubs or gallops.  Respiratory: Clear to auscultation bilaterally.  Abdomen: Positive bowel sounds, soft, nontender, nondistended.  Musculoskeletal/extremities: Mild tenderness of both proximal legs; no acute hot red joints.  Skin: No violaceous papules or rash.   Data Reviewed: Basic Metabolic Panel:  Recent Labs Lab 07/24/15 2101 07/25/15 0509 07/26/15 0510  NA 139 143 142  K 3.6 3.7 4.1  CL 104 109 107  CO2 GLUCOSE 89 99 82  BUN CREATININE 1.16 1.02 0.89  CALCIUM 8.4* 8.4* 8.3*  MG 2.0  --   --   PHOS 3.9  --   --    Liver Function Tests:  Recent Labs Lab 07/24/15 2101 07/25/15 0509 07/26/15 0510  AST 237* 258* 397*  ALT 123* 121* 156*  ALKPHOS 64 58 65  BILITOT 0.7 0.6 0.6  PROT 6.6 6.0* 5.9*  ALBUMIN 3.9 3.3* 3.3*   No results for input(s): LIPASE, AMYLASE in the last 168 hours. No results for input(s): AMMONIA in the last 168 hours. CBC:  Recent Labs Lab 07/24/15 2101 07/25/15 0509  WBC 5.7 6.5   NEUTROABS 2.2  --   HGB 14.2 13.6  HCT 40.8 39.2  MCV 86.4 86.9  PLT 184 203   Cardiac Enzymes:  Recent Labs Lab 07/24/15 2101 07/25/15 1227 07/26/15 0510  CKTOTAL 6941* 9647* 14320*   BNP (last 3 results) No results for input(s): BNP in the last 8760 hours.  ProBNP (last 3 results) No results for input(s): PROBNP in the last 8760 hours.  CBG: No results for input(s): GLUCAP in the last 168 hours.  No results found for this or any previous visit (from the past 240 hour(s)).   Studies: Ct Head Wo Contrast  07/24/2015   CLINICAL DATA:  Acute onset of gradually worsening weakness. Syncope. Drooling and headache. Initial encounter.  EXAM: CT HEAD WITHOUT CONTRAST  TECHNIQUE: Contiguous axial images were obtained from the base of the skull through the vertex without intravenous contrast.  COMPARISON:  None.  FINDINGS: There is no evidence of acute infarction, mass lesion, or intra- or extra-axial hemorrhage on CT.  The posterior fossa, including the cerebellum, brainstem and fourth ventricle, is within normal limits. The third and lateral ventricles, and basal ganglia are unremarkable in appearance. The cerebral hemispheres are symmetric in appearance, with normal gray-white differentiation. No mass effect or midline shift is seen.  There is no evidence of fracture; visualized osseous structures are unremarkable in appearance.  The visualized portions of the orbits are within normal limits. The paranasal sinuses and mastoid air cells are well-aerated. No significant soft tissue abnormalities are seen.  IMPRESSION: Unremarkable noncontrast CT of the head.   Electronically Signed   By: Roanna Raider M.D.   On: 07/24/2015 22:38    Scheduled Meds: . enoxaparin (LOVENOX) injection  40 mg Subcutaneous Q24H  . nicotine  14 mg Transdermal Daily  . sertraline  100 mg Oral Daily  . sodium chloride  3 mL Intravenous Q12H   Continuous Infusions: . sodium chloride 200 mL/hr at 07/26/15 0815    Assessment and plan:  Principal Problem:   Rhabdomyolysis Active Problems:   Near syncope   Transaminitis   Pain in the muscles   Depression with anxiety   Tobacco use   1. Elevated CK, presumed to be rhabdomyolysis. Patient's total CK was 6941 on admission. It has more than doubled in the last 2 days. Patient reports having a similar episode 2-3 years ago where he was treated in [place]. He also reported having acute renal failure ? secondary to rhabdo, necessitating hemodialysis. He does not recall the details. He does not recall being told that he had a autoimmune disease. He denies illicit drug use. He denies being on a statin. -We'll request records/discharge summary for [facility] from 2-3 years ago. -We'll increase IV fluids to 300 cc an hour and continue to monitor CK levels. -We'll order ANA, rheumatoid factor LDH, sedimentation rate. Consider ordering specific autoantibodies in polymyositis or dermatomyositis if total CK does not improve with increased hydration.  Elevated LFTs/hepatic transaminitis. This is likely secondary to rhabdo. We'll continue to monitor.  Near syncope. Etiology likely secondary to dehydration. He is neurologically intact with no symptomatology of presyncope currently. -His TSH was within normal limits.  Depression with anxiety. Currently stable. Will continue alprazolam and sertraline.  Tobacco use. Patient was advised to stop smoking.    Time spent: 35 minutes    [facility]  [facility] Pager [phone]. If 7PM-7AM, please contact night-coverage at [email or web address], password [facility] 07/26/2015, 2:25 PM  LOS: 0 days

## 2015-07-27 LAB — COMPREHENSIVE METABOLIC PANEL
ALT: 192 U/L — AB (ref 17–63)
AST: 501 U/L — ABNORMAL HIGH (ref 15–41)
Albumin: 3.4 g/dL — ABNORMAL LOW (ref 3.5–5.0)
Alkaline Phosphatase: 74 U/L (ref 38–126)
Anion gap: 8 (ref 5–15)
BILIRUBIN TOTAL: 0.6 mg/dL (ref 0.3–1.2)
BUN: 9 mg/dL (ref 6–20)
CO2: 28 mmol/L (ref 22–32)
CREATININE: 0.9 mg/dL (ref 0.61–1.24)
Calcium: 8.4 mg/dL — ABNORMAL LOW (ref 8.9–10.3)
Chloride: 103 mmol/L (ref 101–111)
GFR calc Af Amer: >60 mL/min (ref >60)
GFR calc non Af Amer: >60 mL/min (ref >60)
Glucose, Bld: 91 mg/dL (ref 65–99)
POTASSIUM: 3.9 mmol/L (ref 3.5–5.1)
SODIUM: 139 mmol/L (ref 135–145)
Total Protein: 6.2 g/dL — ABNORMAL LOW (ref 6.5–8.1)

## 2015-07-27 LAB — CK TOTAL AND CKMB (NOT AT ARMC)
CK, MB: 9.1 ng/mL — ABNORMAL HIGH (ref 0.5–5.0)
Relative Index: 0.1 (ref 0.0–2.5)
Total CK: 9769 U/L — ABNORMAL HIGH (ref 49–397)

## 2015-07-27 LAB — RHEUMATOID FACTOR: Rhuematoid fact SerPl-aCnc: 22.2 IU/mL — ABNORMAL HIGH (ref 0.0–13.9)

## 2015-07-27 NOTE — Progress Notes (Signed)
Pt now c/o tooth ache along with leg pain.  Morphine PRN  given, paged Dr to notify.  Will continue to monitor.

## 2015-07-27 NOTE — Progress Notes (Signed)
PROGRESS NOTE  Mario Sharp NAT:[id number] DOB: [birth year] DOA: 07/24/2015 PCP: Purvis Kilts, MD  Summary: 44 yom presented with complaints of leg pain and near syncopal episode. Admitted for Rhabdomyolysis and further evaluation.   Assessment/Plan: 1. Elevated CK, presumed acute rhabdomyolysis. CK better. Bilateral thigh pain, no shoulder pain. Ambulating to bathroom without difficulty. Etiology unclear--no abnormal exertion or exercise, injury, alcohol abuse, illicit drug use, autoimmune disease. Not on statin. No licorice, quail or mushrooms. Patient reporst similar episode 2-3 years ago in [place]. Apparently had rhabdo necessitating HD. Unable to provide further details. Excellent UOP. LDH 592, ESR 5, TSH WNL. 2. Transaminitis, slightly increased. Suspect secondary to acute rhabdomyolysis. 3. Syncope, possibly secondary to dehydration, no recurrence. 4. Depression with anxiety. Stable 5. Tobacco use. Recommend cessation.   Overall better. Renal fxn preserved, excellent UOP.  Labs pending RF, ANA  Check hepatitis panel, screen for HIV; stop Zoloft and Xanax  CMP, phophorus, LDH in AM  Records from [facility] requested.  Continue aggressive IVF  Trend CK  Could consider evaluation for polymyositis or dermatomyositis but has no muscle weakness or rash.  Code Status: Full DVT prophylaxis: Lovenox Family Communication: Discussed with patient. He understands and has no concerns at this time.  Disposition Plan: home when improved  Murray Hodgkins, MD  [facility]  Pager [phone] If 7PM-7AM, please contact night-coverage at [email or web address], password [facility] 07/27/2015, 8:10 AM  LOS: 1 day   Consultants:    Procedures:    Antibiotics:    HPI/Subjective: Feeling better. Still has bilateral leg pain, thighs > calves. No shoulder stiffness. Reports needed dialysis for 1 month in [place], but unable to provide further  details.  Objective: Filed Vitals:   07/25/15 1547 07/25/15 2300 07/26/15 0708 07/27/15 0641  BP: 137/89 125/86 120/87 123/85  Pulse: 90 92 61 65  Temp: 97.5 F (36.4 C) 97 F (36.1 C) 97.5 F (36.4 C) 97.3 F (36.3 C)  TempSrc: Oral Oral Oral Oral  Resp: 16 20 18 16   Height:      Weight:      SpO2: 96% 100% 95% 100%    Intake/Output Summary (Last 24 hours) at 07/27/15 0810 Last data filed at 07/27/15 0649  Gross per 24 hour  Intake 6276.67 ml  Output   4000 ml  Net 2276.67 ml     Filed Weights   07/24/15 1927 07/25/15 0040  Weight: 103.874 kg (229 lb) 104.327 kg (230 lb)    Exam: Afebrile, VSS, no hypoxia General:  Appears calm and comfortable Cardiovascular: RRR, no m/r/g. No LE edema. Respiratory: CTA bilaterally, no w/r/r. Normal respiratory effort. Skin: no rash or induration noted Musculoskeletal: grossly normal tone BUE/BLE. Bilateral thigh tenderness. DP 2+ bilaterally. Distal perfusion appears intact. Psychiatric: grossly normal mood and affect, speech fluent and appropriate Neurologic: grossly non-focal.  New data reviewed:  UOP 4925  CK 14320 >> 8769  AST up to 501  ALT up to 192  LDH 592  Pertinent data since admission:  CT head unremarkable  EKG independently reviewed. SR no acute changes  Pending data:  RF  ANA  Scheduled Meds: . enoxaparin (LOVENOX) injection  40 mg Subcutaneous Q24H  . nicotine  14 mg Transdermal Daily  . sertraline  100 mg Oral Daily  . sodium chloride  3 mL Intravenous Q12H   Continuous Infusions: . sodium chloride 300 mL/hr at 07/27/15 7628    Principal Problem:   Rhabdomyolysis Active Problems:   Near syncope   Transaminitis  Depression with anxiety   Tobacco use   Pain in the muscles      I, Mario Sharp, acting as scribe, recorded this note contemporaneously in the presence of Dr. Melene Plan. Mario Sharp, M.D. on 07/27/2015 .   I have reviewed the above documentation for accuracy and  completeness, and I agree with the above. Murray Hodgkins, MD

## 2015-07-28 DIAGNOSIS — M791 Myalgia: Secondary | ICD-10-CM

## 2015-07-28 LAB — FANA STAINING PATTERNS: Speckled Pattern: 1:80 {titer}

## 2015-07-28 LAB — PHOSPHORUS: Phosphorus: 3.9 mg/dL (ref 2.5–4.6)

## 2015-07-28 LAB — COMPREHENSIVE METABOLIC PANEL
ALK PHOS: 72 U/L (ref 38–126)
ALT: 202 U/L — ABNORMAL HIGH (ref 17–63)
AST: 428 U/L — ABNORMAL HIGH (ref 15–41)
Albumin: 3.5 g/dL (ref 3.5–5.0)
Anion gap: 5 (ref 5–15)
BILIRUBIN TOTAL: 0.6 mg/dL (ref 0.3–1.2)
BUN: 9 mg/dL (ref 6–20)
CALCIUM: 8.6 mg/dL — AB (ref 8.9–10.3)
CO2: 28 mmol/L (ref 22–32)
Chloride: 105 mmol/L (ref 101–111)
Creatinine, Ser: 0.86 mg/dL (ref 0.61–1.24)
GFR calc non Af Amer: >60 mL/min (ref >60)
Glucose, Bld: 92 mg/dL (ref 65–99)
Potassium: 3.9 mmol/L (ref 3.5–5.1)
Sodium: 138 mmol/L (ref 135–145)
Total Protein: 6.2 g/dL — ABNORMAL LOW (ref 6.5–8.1)

## 2015-07-28 LAB — CK: Total CK: 6812 U/L — ABNORMAL HIGH (ref 49–397)

## 2015-07-28 LAB — ANTINUCLEAR ANTIBODIES, IFA

## 2015-07-28 LAB — LACTATE DEHYDROGENASE: LDH: 380 U/L — ABNORMAL HIGH (ref 98–192)

## 2015-07-28 NOTE — Progress Notes (Signed)
PROGRESS NOTE  Mario Sharp WKG:[id number] DOB: [birth year] DOA: 07/24/2015 PCP: Purvis Kilts, MD  Summary: 31 yom presented with complaints of leg pain and near syncopal episode. Admitted for Rhabdomyolysis and further evaluation.   Assessment/Plan: 1. Elevated CK, presumed acute rhabdomyolysis. Starting to trend down. Some thigh soreness but otherwise doing well. No weakness. Etiology unclear--no abnormal exertion or exercise, injury, alcohol abuse, illicit drug use, autoimmune disease. Not on statin. No licorice, quail or mushrooms. Patient reports similar episode 2-3 years ago in [place]. Apparently had rhabdo necessitating HD. Unable to provide further details. Excellent UOP. ESR 5, TSH WNL. Rheumatoid factor is elevated, ANA is borderline with speckled pattern. 2. Transaminitis, overall stable. Suspect secondary to acute rhabdomyolysis. 3. Syncope, possibly secondary to dehydration, no recurrence. 4. Depression with anxiety. Remains stable. 5. Tobacco use. Recommend cessation.   CK continues to improve. LFTs remain stable.  Labs as noted above with elevated rheumatoid factor and borderline ANA.  Records from [facility] requested.  Continue aggressive IVF  Trend CK  We will discuss the results with rheumatology by telephone A/9 for further recommendations.  Code Status: Full DVT prophylaxis: Lovenox Family Communication: Updated patient on CK and possible discharge in the morning Disposition Plan: home when improved  Murray Hodgkins, MD  [facility]  Pager [phone] If 7PM-7AM, please contact night-coverage at [email or web address], password [facility] 07/28/2015, 6:20 PM  LOS: 2 days   Consultants:    Procedures:    Antibiotics:    HPI/Subjective: Ambulating without difficulty. No arm or leg weakness. Some thigh soreness but overall improving.  Objective: Filed Vitals:   07/27/15 1437 07/27/15 2052 07/28/15 0631 07/28/15 1324  BP: 121/83  139/86 123/72 117/73  Pulse: 77 75 59 74  Temp: 98.6 F (37 C) 97.9 F (36.6 C) 98.4 F (36.9 C) 98.2 F (36.8 C)  TempSrc: Oral Oral Oral   Resp: 18 20 14 16   Height:      Weight:      SpO2: 94% 96% 94% 97%    Intake/Output Summary (Last 24 hours) at 07/28/15 1820 Last data filed at 07/28/15 1300  Gross per 24 hour  Intake   4165 ml  Output   2300 ml  Net   1865 ml     Filed Weights   07/24/15 1927 07/25/15 0040  Weight: 103.874 kg (229 lb) 104.327 kg (230 lb)    Exam: General:  Appears comfortable, calm. Cardiovascular: Regular rate and rhythm, no murmur, rub or gallop. No lower extremity edema. Respiratory: Clear to auscultation bilaterally, no wheezes, rales or rhonchi. Normal respiratory effort. Musculoskeletal: Thighs appear unremarkable. Grossly normal tone all extremities. Psychiatric: grossly normal mood and affect, speech fluent and appropriate  New data reviewed:  UOP 3200, I/O do not appear to be accurate, he has no significant edema.  CK 14320 >> 9769 >> 6812  AST down to 428  ALT up to 202  LDH 592 >> 380  Pertinent data since admission:  CT head unremarkable  EKG independently reviewed. SR no acute changes  Pending data:    Scheduled Meds: . enoxaparin (LOVENOX) injection  40 mg Subcutaneous Q24H  . nicotine  14 mg Transdermal Daily  . sodium chloride  3 mL Intravenous Q12H   Continuous Infusions: . sodium chloride 150 mL/hr at 07/28/15 1228    Principal Problem:   Rhabdomyolysis Active Problems:   Near syncope   Transaminitis   Depression with anxiety   Tobacco use   Pain in the muscles  Time: 20 minutes.

## 2015-07-29 ENCOUNTER — Encounter (HOSPITAL_COMMUNITY): Payer: Self-pay | Admitting: Family Medicine

## 2015-07-29 DIAGNOSIS — R768 Other specified abnormal immunological findings in serum: Secondary | ICD-10-CM

## 2015-07-29 DIAGNOSIS — R894 Abnormal immunological findings in specimens from other organs, systems and tissues: Secondary | ICD-10-CM

## 2015-07-29 HISTORY — DX: Other specified abnormal immunological findings in serum: R76.8

## 2015-07-29 LAB — COMPREHENSIVE METABOLIC PANEL
ALBUMIN: 3.6 g/dL (ref 3.5–5.0)
ALT: 200 U/L — ABNORMAL HIGH (ref 17–63)
AST: 309 U/L — AB (ref 15–41)
Alkaline Phosphatase: 72 U/L (ref 38–126)
Anion gap: 6 (ref 5–15)
BUN: 10 mg/dL (ref 6–20)
CO2: 27 mmol/L (ref 22–32)
Calcium: 8.9 mg/dL (ref 8.9–10.3)
Chloride: 105 mmol/L (ref 101–111)
Creatinine, Ser: 0.93 mg/dL (ref 0.61–1.24)
GFR calc Af Amer: >60 mL/min (ref >60)
Glucose, Bld: 89 mg/dL (ref 65–99)
POTASSIUM: 3.9 mmol/L (ref 3.5–5.1)
SODIUM: 138 mmol/L (ref 135–145)
Total Bilirubin: 0.6 mg/dL (ref 0.3–1.2)
Total Protein: 6.6 g/dL (ref 6.5–8.1)

## 2015-07-29 LAB — HEPATITIS PANEL, ACUTE
HCV Ab: >11 s/co ratio — ABNORMAL HIGH (ref 0.0–0.9)
Hep A IgM: NEGATIVE
Hep B C IgM: NEGATIVE
Hepatitis B Surface Ag: NEGATIVE

## 2015-07-29 LAB — CK: CK TOTAL: 2756 U/L — AB (ref 49–397)

## 2015-07-29 LAB — HIV ANTIBODY (ROUTINE TESTING W REFLEX): HIV Screen 4th Generation wRfx: NONREACTIVE

## 2015-07-29 MED ORDER — OXYCODONE HCL 5 MG PO TABS: 5.0000 mg | ORAL_TABLET | Freq: Four times a day (QID) | ORAL | Status: AC | PRN

## 2015-07-29 NOTE — Care Management Note (Signed)
Case Management Note  Patient Details  Name: Mario Sharp MRN: [id number] Date of Birth: 1976-07-30  Subjective/Objective:                    Action/Plan:   Expected Discharge Date:  07/28/15               Expected Discharge Plan:  Home/Self Care  In-House Referral:  Financial Counselor  Discharge planning Services  CM Consult, Follow-up appt scheduled  Post Acute Care Choice:  NA Choice offered to:  NA  DME Arranged:    DME Agency:     HH Arranged:    HH Agency:     Status of Service:  Completed, signed off  Medicare Important Message Given:    Date Medicare IM Given:    Medicare IM give by:    Date Additional Medicare IM Given:    Additional Medicare Important Message give by:     If discussed at Long Length of Stay Meetings, dates discussed:    Additional Comments: Pt discharged home today. Follow up appt made at [facility] and pt notifed of appt time and instructions. Appt documented on AVS. Pt and pts nurse aware of discharge arrangments. Arlyss Queen Baker, RN 07/29/2015, 12:53 PM

## 2015-07-29 NOTE — Discharge Summary (Signed)
Physician Discharge Summary  Kegley BEAUMIER MWN:[id number] DOB: 1976-08-17 DOA: 07/24/2015  PCP: Purvis Kilts, MD  Admit date: 07/24/2015 Discharge date: 07/29/2015  Recommendations for Outpatient Follow-up:   Follow-up HCV antibody positive. HCV RNA quantitative reflex pending.  Follow-up elevated AST, ALT  Follow-up possible autoimmune etiology of rhabdomyolysis. RF and ANA positive.  Patient's phone [phone]  Discharge Diagnoses:  1. Acute rhabdomyolysis. 2. Transaminitis 3. Syncope. 4. HCV positive 5. RF and ANA positive 6. Depression with anxiety 7. Tobacco use.   Discharge Condition: Improved Disposition: Home  Diet recommendation: Regular   Filed Weights   07/24/15 1927 07/25/15 0040  Weight: 103.874 kg (229 lb) 104.327 kg (230 lb)    History of present illness:  70 yom presented with complaints of leg pain and near syncopal episode. Admitted for Rhabdomyolysis and further evaluation.   Hospital Course:  Mr. Mcglocklin was treated for rhabdomyolysis with aggressive IV fluids, initially CK increased. However this is now trending back towards normal with aggressive IV fluids. He had no over exertion and has not exercised recently. Etiology of his rhabdomyolysis was unclear although he did report a history of this several years ago when he required a month of dialysis. Extensive laboratory investigation was undertaken which was notable for positive rheumatoid factor and ANA. Also noted was positive HCV of unclear duration. Given his previous history of a similar presentation, etiology is suspected to be autoimmune in nature and not secondary to HCV. We discussed the possibility of HCV in detail, confirmatory testing is in process. He is advised on precautions to prevent transmission of HCV.  1. Acute rhabdomyolysis with elevated CK. Continues to trend down. Some thigh soreness but otherwise doing well. No weakness. Etiology unclear--no abnormal exertion or  exercise, injury, alcohol abuse, illicit drug use, ho history of autoimmune disease. Not on statin. No licorice, quail or mushrooms. Patient reports similar episode 2-3 years ago in [place]. Apparently had rhabdo necessitating HD. Unable to provide further details and though records were requested, none were received. Excellent UOP. ESR 5, TSH WNL. Rheumatoid factor is elevated, ANA is borderline with speckled pattern. 2. Transaminitis, overall stable/improving. Secondary to acute rhabdomyolysis 3. HCV positive. Denies IVDU. 4. Syncope, possibly secondary to dehydration, no recurrence. 5. Depression with anxiety. Remains stable. 6. Tobacco use. Recommend cessation.   Will discuss labs with rheumatology and f/u by phone and update Health Department  No consultations or procedures   Discharge Instructions   Discharge Medication List as of 07/29/2015  1:36 PM    START taking these medications   Details  oxyCODONE (OXY IR/ROXICODONE) 5 MG immediate release tablet Take 1 tablet (5 mg total) by mouth every 6 (six) hours as needed for severe pain., Starting 07/29/2015, Until Discontinued, Print      CONTINUE these medications which have NOT CHANGED   Details  ALPRAZolam (XANAX) 1 MG tablet Take 1.5 mg by mouth daily as needed for anxiety. , Until Discontinued, Historical Med    Continue Zoloft 100 mg daily                 No Known Allergies  The results of significant diagnostics from this hospitalization (including imaging, microbiology, ancillary and laboratory) are listed below for reference.    Significant Diagnostic Studies: Ct Head Wo Contrast  07/24/2015   CLINICAL DATA:  Acute onset of gradually worsening weakness. Syncope. Drooling and headache. Initial encounter.  EXAM: CT HEAD WITHOUT CONTRAST  TECHNIQUE: Contiguous axial images were obtained from the base of  the skull through the vertex without intravenous contrast.  COMPARISON:  None.  FINDINGS: There is no evidence  of acute infarction, mass lesion, or intra- or extra-axial hemorrhage on CT.  The posterior fossa, including the cerebellum, brainstem and fourth ventricle, is within normal limits. The third and lateral ventricles, and basal ganglia are unremarkable in appearance. The cerebral hemispheres are symmetric in appearance, with normal gray-white differentiation. No mass effect or midline shift is seen.  There is no evidence of fracture; visualized osseous structures are unremarkable in appearance. The visualized portions of the orbits are within normal limits. The paranasal sinuses and mastoid air cells are well-aerated. No significant soft tissue abnormalities are seen.  IMPRESSION: Unremarkable noncontrast CT of the head.   Electronically Signed   By: Garald Balding M.D.   On: 07/24/2015 22:38      Labs: Basic Metabolic Panel:  Recent Labs Lab 07/24/15 2101 07/25/15 0509 07/26/15 0510 07/27/15 0454 07/28/15 0643 07/29/15 0525  NA 139 143 142 139 138 138  K 3.6 3.7 4.1 3.9 3.9 3.9  CL 104 109 107 103 105 105  CO2 29 26 28 28 28 27   GLUCOSE 89 99 82 91 92 89  BUN 11 13 9 9 9 10   CREATININE 1.16 1.02 0.89 0.90 0.86 0.93  CALCIUM 8.4* 8.4* 8.3* 8.4* 8.6* 8.9  MG 2.0  --   --   --   --   --   PHOS 3.9  --   --   --  3.9  --    Liver Function Tests:  Recent Labs Lab 07/25/15 0509 07/26/15 0510 07/27/15 0454 07/28/15 0643 07/29/15 0525  AST 258* 397* 501* 428* 309*  ALT 121* 156* 192* 202* 200*  ALKPHOS 58 65 74 72 72  BILITOT 0.6 0.6 0.6 0.6 0.6  PROT 6.0* 5.9* 6.2* 6.2* 6.6  ALBUMIN 3.3* 3.3* 3.4* 3.5 3.6   CBC:  Recent Labs Lab 07/24/15 2101 07/25/15 0509  WBC 5.7 6.5  NEUTROABS 2.2  --   HGB 14.2 13.6  HCT 40.8 39.2  MCV 86.4 86.9  PLT 184 203   Cardiac Enzymes:  Recent Labs Lab 07/25/15 1227 07/26/15 0510 07/27/15 0454 07/28/15 0643 07/29/15 0525  CKTOTAL 9647* 14320* 9769* 6812* 2756*  CKMB  --   --  9.1*  --   --       Principal Problem:    Rhabdomyolysis Active Problems:   Near syncope   Transaminitis   Depression with anxiety   Tobacco use   Pain in the muscles   HCV antibody positive   ANA positive   Rheumatoid factor positive   Time coordinating discharge: 40 minutes  Signed:  Murray Hodgkins, MD [facility] 07/29/2015, 7:02 AM   I, Salvadore Oxford, acting a scribe, recorded this note contemporaneously in the presence of Dr. Melene Plan. [name], M.D. on 07/29/2015   I have reviewed the above documentation for accuracy and completeness, and I agree with the above. Murray Hodgkins, MD

## 2015-07-29 NOTE — Progress Notes (Signed)
PROGRESS NOTE  Mario Sharp QTM:[id number] DOB: 10-Apr-1976 DOA: 07/24/2015 PCP: None Follow-up arranged at [facility]  Summary: 62 yom presented with complaints of leg pain and near syncopal episode. Admitted for Rhabdomyolysis and further evaluation.   Assessment/Plan: 1. Acute rhabdomyolysis with elevated CK. Continues to trend down. Some thigh soreness but otherwise doing well. No weakness. Etiology unclear--no abnormal exertion or exercise, injury, alcohol abuse, illicit drug use, ho history of autoimmune disease. Not on statin. No licorice, quail or mushrooms. Patient reports similar episode 2-3 years ago in [place]. Apparently had rhabdo necessitating HD. Unable to provide further details and though records were requested, none were received. Excellent UOP. ESR 5, TSH WNL. Rheumatoid factor is elevated, ANA is borderline with speckled pattern. 2. Transaminitis, overall stable/improving. Secondary to acute rhabdomyolysis 3. HCV positive. Denies IVDU. 4. Syncope, possibly secondary to dehydration, no recurrence. 5. Depression with anxiety. Remains stable. 6. Tobacco use. Recommend cessation.   Overall much better  Drink plenty of fluids  Discharge home today  Arranged outpatient follow up with [facility]  Discussed HCV with patient, will order confirmatory testing and f/u with him by phone.  Will discuss labs with rheumatology and f/u by phone and update [facility]  [phone]  Code Status: Full DVT prophylaxis: Lovenox Family Communication: pt alone.  Understands care plan  Disposition Plan: Home today  Murray Hodgkins, MD  [facility]  Pager [phone] If 7PM-7AM, please contact night-coverage at [email or web address], password [facility] 07/29/2015, 6:58 AM  LOS: 3 days   Consultants:    Procedures:    Antibiotics:    HPI/Subjective: Feeling better, slept okay.  No n/v. Still some thigh pain. No weakness.  Objective: Filed Vitals:   07/28/15 0631 07/28/15 1324 07/28/15 2206 07/29/15 0627  BP: 123/72 117/73 148/99 136/88  Pulse: 59 74 86 90  Temp: 98.4 F (36.9 C) 98.2 F (36.8 C) 97.8 F (36.6 C) 98.8 F (37.1 C)  TempSrc: Oral  Oral Oral  Resp: 14 16 18 18   Height:      Weight:      SpO2: 94% 97% 97%     Intake/Output Summary (Last 24 hours) at 07/29/15 0658 Last data filed at 07/29/15 0626  Gross per 24 hour  Intake    680 ml  Output    800 ml  Net   -120 ml     Filed Weights   07/24/15 1927 07/25/15 0040  Weight: 103.874 kg (229 lb) 104.327 kg (230 lb)    Exam: Afebrile, VSS General:  Appears calm and comfortable Cardiovascular: RRR, no m/r/g. No LE edema. Respiratory: CTA bilaterally, no w/r/r. Normal respiratory effort. Abdomen: soft, ntnd Skin: few abrasions, upper extremity, no pain with palpation of thighs Musculoskeletal: grossly normal tone BUE/BLE Psychiatric: grossly normal mood and affect, speech fluent and appropriate Neurologic: grossly non-focal.   New data reviewed:  BMP unremarkable  AST down to 309  ALT stable 200  CK 2756  HCV antibody positive  Pertinent data since admission:  CT head unremarkable  EKG independently reviewed. SR no acute changes  Pending data:    Scheduled Meds: . enoxaparin (LOVENOX) injection  40 mg Subcutaneous Q24H  . nicotine  14 mg Transdermal Daily  . sodium chloride  3 mL Intravenous Q12H   Continuous Infusions: . sodium chloride 150 mL/hr at 07/29/15 0209    Principal Problem:   Rhabdomyolysis Active Problems:   Near syncope   Transaminitis   Depression with anxiety   Tobacco use   Pain  in the muscles      I, Arielle Khosrowpour, acting a scribe, recorded this note contemporaneously in the presence of Dr. Melene Plan. [name], M.D. on 07/29/2015    I have reviewed the above documentation for accuracy and completeness, and I agree with the above. Murray Hodgkins, MD

## 2015-07-31 LAB — HCV RNA QUANT RFLX ULTRA OR GENOTYP
HCV RNA QNT(LOG COPY/ML): 5.729 {Log_IU}/mL
HEPATITIS C QUANTITATION: 536270 [IU]/mL

## 2015-07-31 LAB — HEPATITIS C GENOTYPE

## 2015-09-05 ENCOUNTER — Telehealth: Payer: Self-pay | Admitting: Family Medicine

## 2015-09-05 NOTE — Telephone Encounter (Signed)
Reviewed case with rheumatologist Dr. Nickola Major. Speckled pattern ANA insignificant and requires no further followup. Did suggest further outpatient evaluation, although HCV a possible etiology, seems less likely given patient's history of rhabdomyolysis and AKI requiring dialysis (by report, records never received) in past. Possible genetic disorder but no specific testing recommended. Main rec for adequate hydration.  Discussed with patient above last week. Aware of recommendations.  He did not keep followup with [facility] health department. I encouraged him to reschedule.  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 It is imperative that he reschedule with [facility] and obtain referral to GI for consideration of treatment for cure. We discussed the potential implications of not seeking treatment as well as precautions including no unprotected sex, sharing toothbrush and avoiding exposure of blood to others.  Brendia Sacks, MD

## 2016-09-07 IMAGING — CT CT HEAD W/O CM
1 series · 16 of 30 positions shown, 20 images · non-contrast
Comparison: None.

CLINICAL DATA: Acute onset of gradually worsening weakness.
Syncope. Drooling and headache. Initial encounter.

EXAM:
CT HEAD WITHOUT CONTRAST
TECHNIQUE: Contiguous axial images were obtained from the base of the skull
through the vertex without intravenous contrast.

[Series 2: headseq 4.8 h37s · axial · 0.43mm/px · z∈[+73,+231]mm · 16 of 36 slices shown, 20 images]
[im 2/36  brain]
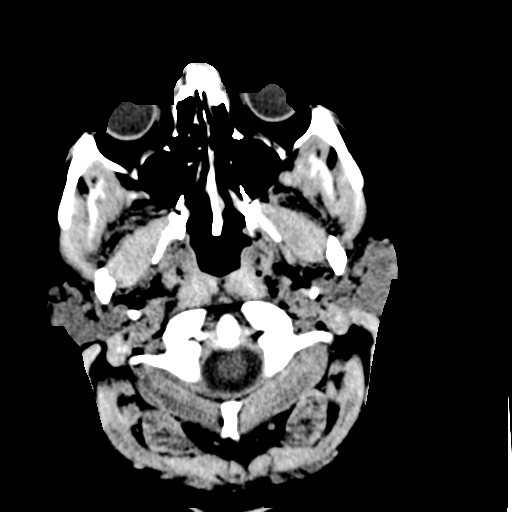
[im 2/36  bone]
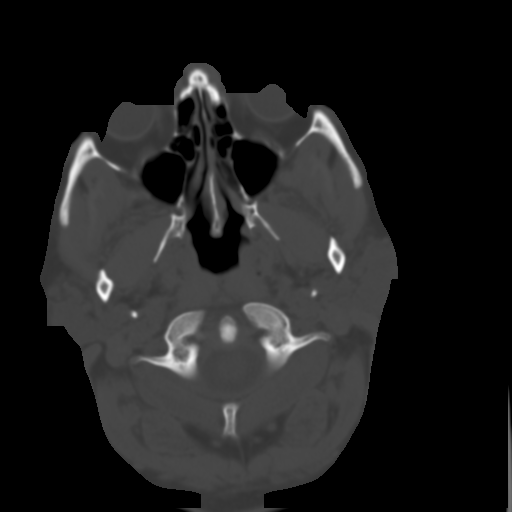
[im 4/36  brain]
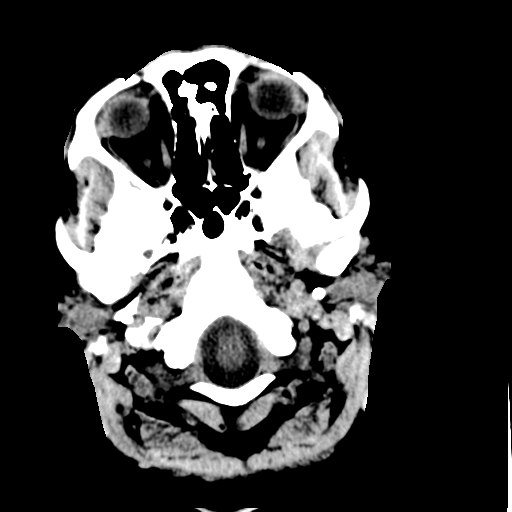
[im 7/36  brain]
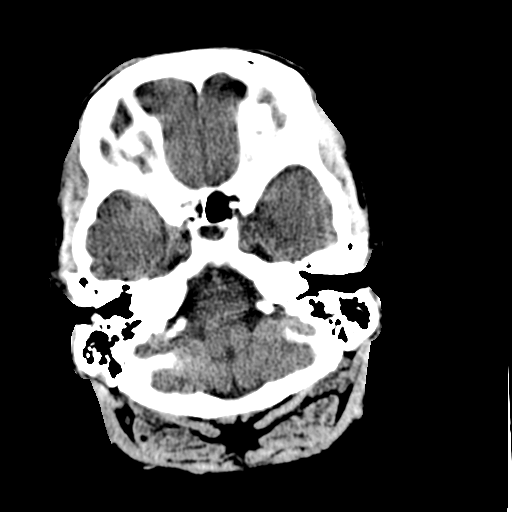
[im 9/36  brain]
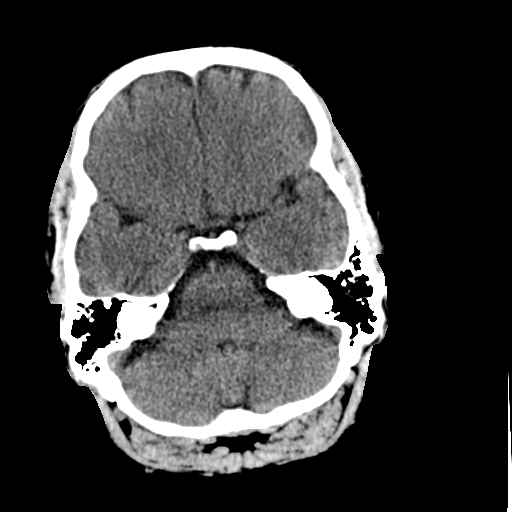
[im 10/36  brain]
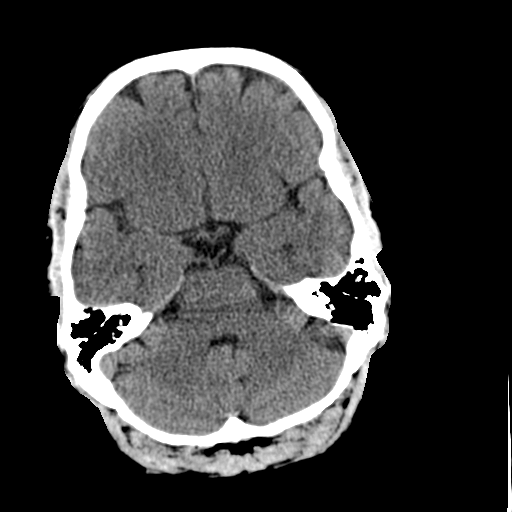
[im 10/36  bone]
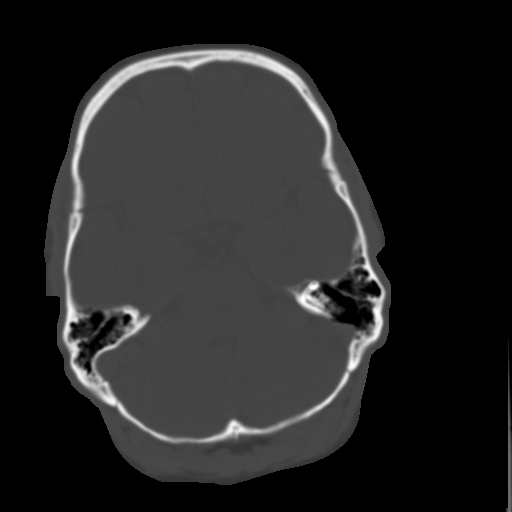
[im 13/36  brain]
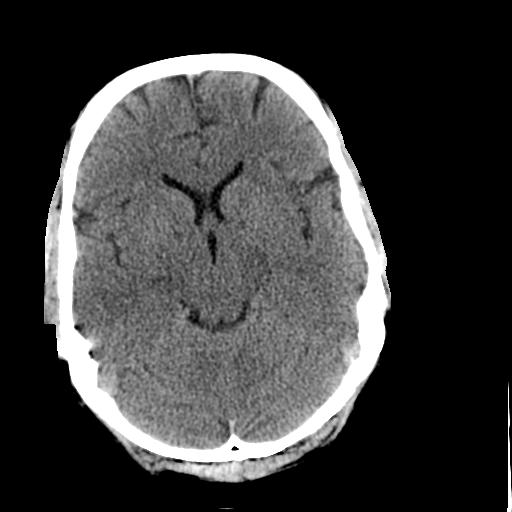
[im 15/36  brain]
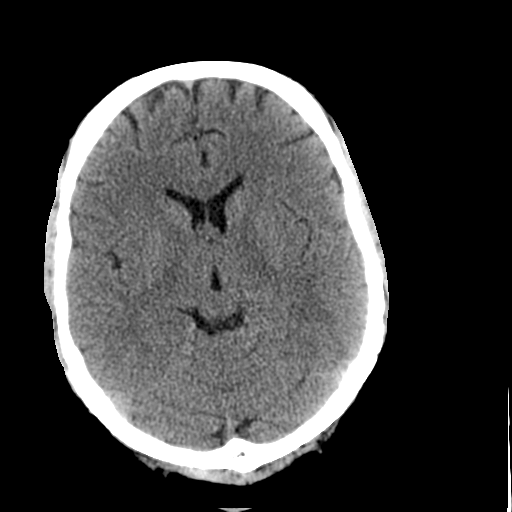
[im 17/36  brain]
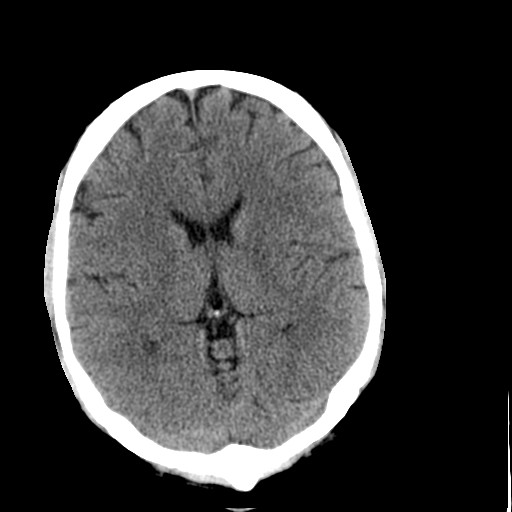
[im 19/36  brain]
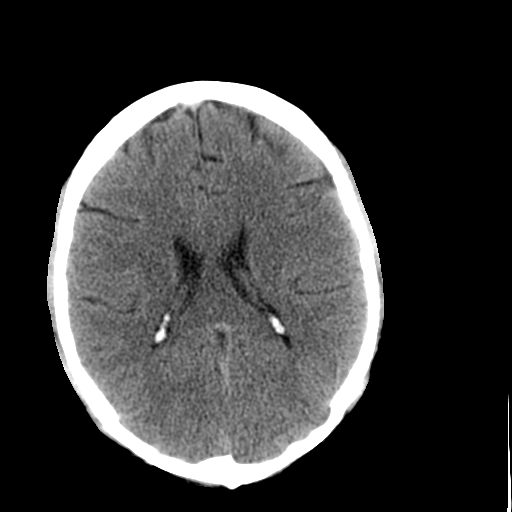
[im 19/36  bone]
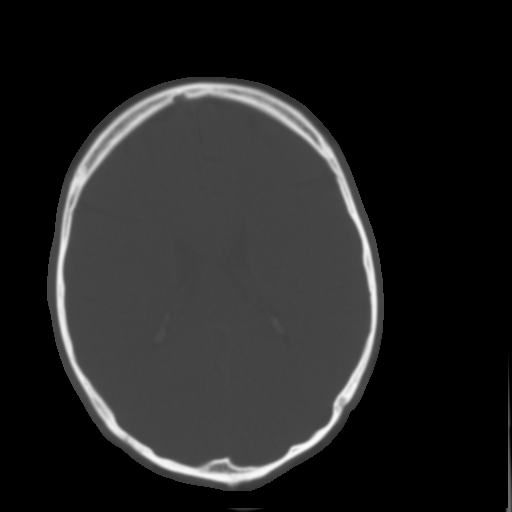
[im 21/36  brain]
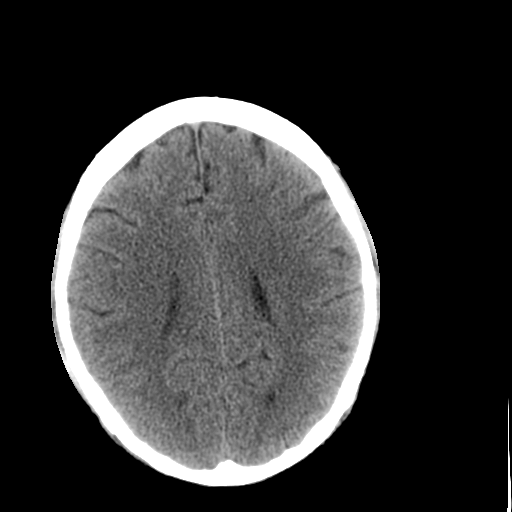
[im 23/36  brain]
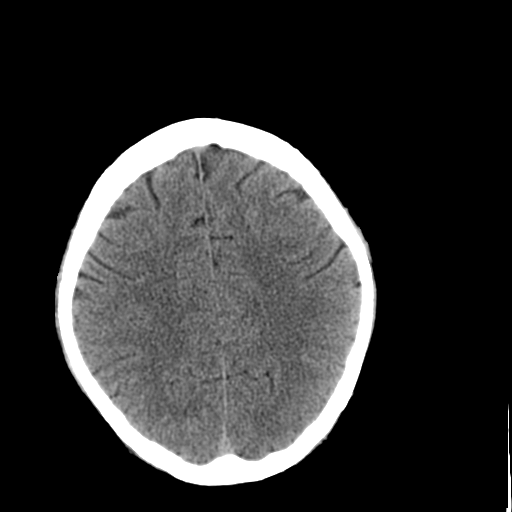
[im 26/36  brain]
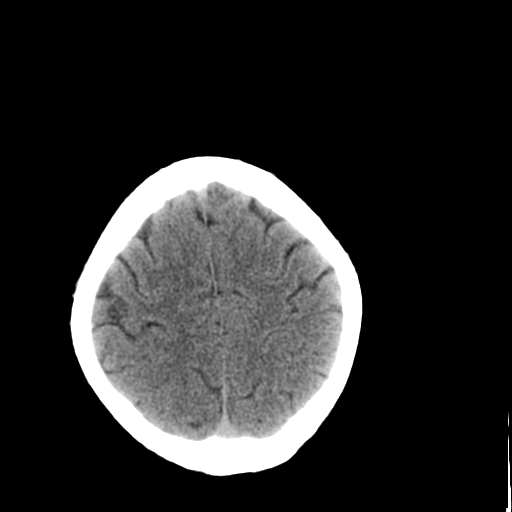
[im 27/36  brain]
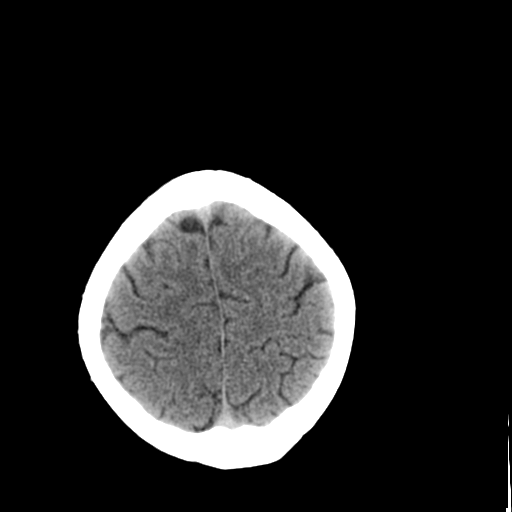
[im 27/36  bone]
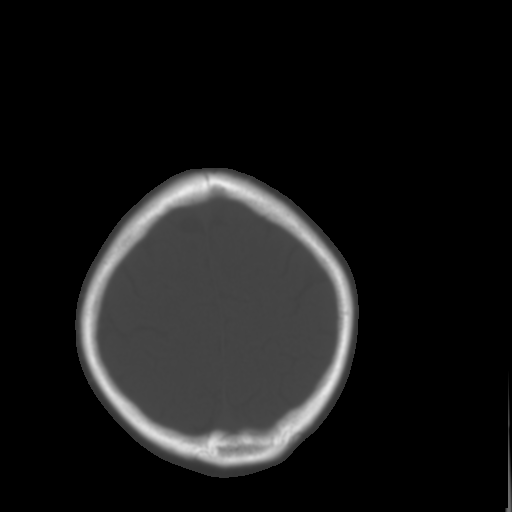
[im 29/36  brain]
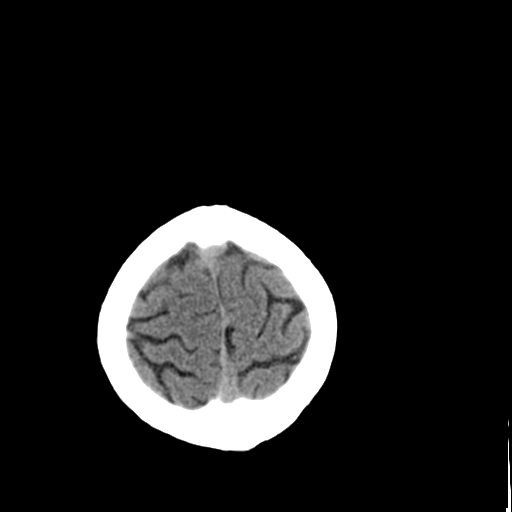
[im 32/36  brain]
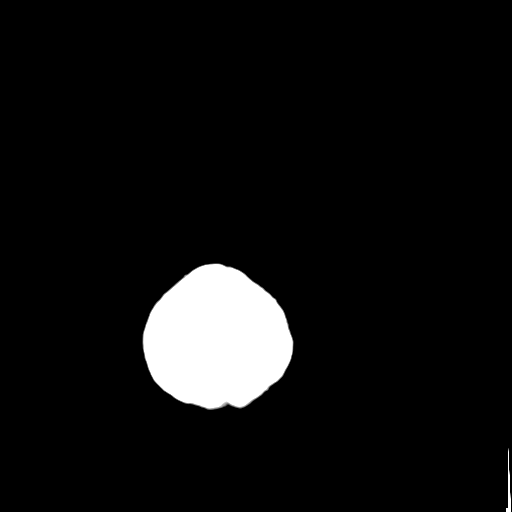
[im 34/36  brain]
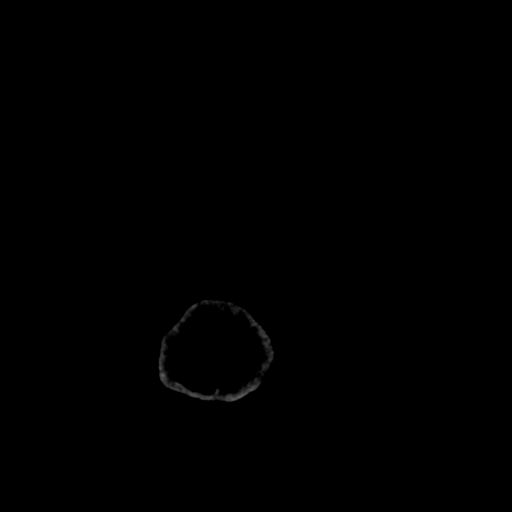

[16 of 30 positions shown; findings below may reference images not displayed]

FINDINGS: There is no evidence of acute infarction, mass lesion, or intra- or
extra-axial hemorrhage on CT.

The posterior fossa, including the cerebellum, brainstem and fourth
ventricle, is within normal limits. The third and lateral
ventricles, and basal ganglia are unremarkable in appearance. The
cerebral hemispheres are symmetric in appearance, with normal
gray-white differentiation. No mass effect or midline shift is seen.

There is no evidence of fracture; visualized osseous structures are
unremarkable in appearance. The visualized portions of the orbits
are within normal limits. The paranasal sinuses and mastoid air
cells are well-aerated. No significant soft tissue abnormalities are
seen.
IMPRESSION: Unremarkable noncontrast CT of the head.

## 2020-07-14 ENCOUNTER — Other Ambulatory Visit: Payer: Self-pay | Admitting: Orthopedic Surgery

## 2020-07-14 DIAGNOSIS — M25511 Pain in right shoulder: Secondary | ICD-10-CM

## 2020-08-08 ENCOUNTER — Other Ambulatory Visit: Payer: Self-pay

## 2020-08-08 ENCOUNTER — Ambulatory Visit
Admission: RE | Admit: 2020-08-08 | Discharge: 2020-08-08 | Disposition: A | Discharge status: Home / Self Care | Payer: Medicaid Other | Source: Ambulatory Visit | Attending: Orthopedic Surgery | Admitting: Orthopedic Surgery

## 2020-08-08 DIAGNOSIS — M25511 Pain in right shoulder: Secondary | ICD-10-CM

## 2022-04-19 ENCOUNTER — Encounter (INDEPENDENT_AMBULATORY_CARE_PROVIDER_SITE_OTHER): Payer: Self-pay | Admitting: *Deleted

## 2022-06-15 ENCOUNTER — Encounter (INDEPENDENT_AMBULATORY_CARE_PROVIDER_SITE_OTHER): Payer: Self-pay | Admitting: Gastroenterology

## 2022-06-15 ENCOUNTER — Encounter (INDEPENDENT_AMBULATORY_CARE_PROVIDER_SITE_OTHER): Payer: Self-pay | Admitting: *Deleted

## 2022-06-15 ENCOUNTER — Ambulatory Visit (INDEPENDENT_AMBULATORY_CARE_PROVIDER_SITE_OTHER): Payer: Medicaid Other | Admitting: Gastroenterology
